# Patient Record
Sex: Male | Born: 1975 | Race: Black or African American | Hispanic: No | Marital: Single | Smoking: Current every day smoker
Health system: Southern US, Community
[De-identification: ages and names within clinical notes are randomized; demographics above are authoritative.]

## PROBLEM LIST (undated history)

## (undated) DIAGNOSIS — K219 Gastro-esophageal reflux disease without esophagitis: Secondary | ICD-10-CM

## (undated) DIAGNOSIS — G459 Transient cerebral ischemic attack, unspecified: Secondary | ICD-10-CM

## (undated) DIAGNOSIS — I10 Essential (primary) hypertension: Secondary | ICD-10-CM

---

## 2016-08-07 ENCOUNTER — Ambulatory Visit: Admit: 2016-08-07 | Discharge: 2016-08-07 | Payer: PRIVATE HEALTH INSURANCE | Attending: Family Medicine

## 2016-08-07 DIAGNOSIS — I10 Essential (primary) hypertension: Secondary | ICD-10-CM

## 2016-08-07 MED ORDER — LISINOPRIL-HYDROCHLOROTHIAZIDE 20 MG-25 MG TAB
20-25 mg | ORAL_TABLET | Freq: Every day | ORAL | 2 refills | Status: DC
Start: 2016-08-07 — End: 2017-04-15

## 2016-08-07 MED ORDER — LOVASTATIN 20 MG TAB
20 mg | ORAL_TABLET | Freq: Every evening | ORAL | 2 refills | Status: DC
Start: 2016-08-07 — End: 2017-04-15

## 2016-08-07 MED ORDER — ASPIRIN 81 MG TAB, DELAYED RELEASE
81 mg | ORAL_TABLET | Freq: Every day | ORAL | 2 refills | Status: DC
Start: 2016-08-07 — End: 2017-04-15

## 2016-08-07 MED ORDER — AMLODIPINE 10 MG TAB
10 mg | ORAL_TABLET | Freq: Every day | ORAL | 2 refills | Status: DC
Start: 2016-08-07 — End: 2017-04-15

## 2016-08-07 NOTE — Progress Notes (Signed)
HPI  Arthur IvanoffClarence Thompson is a 41 y.o. male being seen today for   Chief Complaint   Patient presents with   ??? New Patient     Establsih healthcare services   ??? Hypertension     Monitor and medicate HTN   ??? Hospital Follow Up     HTN   .iov for this pt to care a Arthur Thompson  he states that he has high blood presure.  Previously on amlodipine, lisinopril and hctz.   Recently restarted lisin/hctz from ED but he does not have his amlodipine and bp still high.     Past Medical History:   Diagnosis Date   ??? GERD (gastroesophageal reflux disease) 2010   ??? Hypertension 2000   ??? Stroke (HCC) 2014         ROS  Patient states that he is feeling well. Denies complaints of chest pain, shortness of breath, swelling of legs, dizziness or weakness. he denies nausea, vomiting or diarrhea.        Current Outpatient Prescriptions   Medication Sig   ??? amLODIPine (NORVASC) 10 mg tablet Take 1 Tab by mouth daily.   ??? lovastatin (MEVACOR) 20 mg tablet Take 1 Tab by mouth nightly.   ??? aspirin delayed-release 81 mg tablet Take 1 Tab by mouth daily.   ??? lisinopril-hydroCHLOROthiazide (PRINZIDE, ZESTORETIC) 20-25 mg per tablet Take 1 Tab by mouth daily.     No current facility-administered medications for this visit.        PE  Visit Vitals   ??? BP (!) 190/120 (BP 1 Location: Right arm, BP Patient Position: Sitting)   ??? Pulse 80   ??? Temp 97 ??F (36.1 ??C) (Oral)   ??? Resp 18   ??? Ht 5\' 10"  (1.778 m)   ??? Wt 200 lb (90.7 kg)   ??? SpO2 99%   ??? BMI 28.7 kg/m2        Alert and oriented with normal mood and affect. he is well developed and well nourished . Lungs are clear without wheezing. Heart rate is regular without murmurs or gallops. There is no lower extremity edema.     No results found for this or any previous visit.      Assessment and Plan:        ICD-10-CM ICD-9-CM    1. Essential hypertension I10 401.9    2. Smoker F17.200 305.1    3. CKD (chronic kidney disease) stage 2, GFR 60-89 ml/min N18.2 585.2      Review outside records   Follow up one month check bp and lab  Add amlodipine, statin today      Donnie MesaEmily W Doron Shake, MD

## 2016-08-07 NOTE — Progress Notes (Signed)
1. When and where did you last receive medical care?  Yes [facility] ED HTN-     2.When and where did you last have preventive care such as mammogram, pap smears or colon screening? N/A    3. What is your current living situation (for example, live alone, live in home with immediate family members)? [place] [facility]    4. Do you have any problems with communication such trouble seeing, hearing, or understanding instructions?No    5. Do you have an advance directive?  This is a document that you can give to family members with instructions for how you would want them to make health care decisions for you if you were unable to speak for yourself.  (For example, unconscious, delerious)No    PMH/FH/Social Hx reviewed and updated as needed     Applicable screenings reviewed and updated as needed  Medication reconciliation performed. Patient does need medication refills.  Health Maintenance reviewed.

## 2016-08-07 NOTE — Progress Notes (Signed)
Discharge instructions reviewed with patient    Medication list and understanding of medications reviewed with patient.   OTC and herbal medications reviewed and added to med list if applicable  Barriers to adherence assessed.    Guidance given regarding new medications this visit, including reason for taking this medicine, and common side effects.    [facility]

## 2016-09-11 ENCOUNTER — Encounter: Attending: Family Medicine

## 2017-02-19 ENCOUNTER — Encounter (HOSPITAL_COMMUNITY): Payer: Self-pay

## 2017-02-19 ENCOUNTER — Emergency Department (HOSPITAL_COMMUNITY): Payer: Self-pay

## 2017-02-19 DIAGNOSIS — R079 Chest pain, unspecified: Secondary | ICD-10-CM | POA: Insufficient documentation

## 2017-02-19 DIAGNOSIS — Z5321 Procedure and treatment not carried out due to patient leaving prior to being seen by health care provider: Secondary | ICD-10-CM | POA: Insufficient documentation

## 2017-02-19 LAB — BASIC METABOLIC PANEL
ANION GAP: 10 (ref 5–15)
BUN: 19 mg/dL (ref 6–20)
CO2: 28 mmol/L (ref 22–32)
Calcium: 9.6 mg/dL (ref 8.9–10.3)
Chloride: 99 mmol/L — ABNORMAL LOW (ref 101–111)
Creatinine, Ser: 1.46 mg/dL — ABNORMAL HIGH (ref 0.61–1.24)
GFR, EST NON AFRICAN AMERICAN: 58 mL/min — AB (ref >60)
GLUCOSE: 97 mg/dL (ref 65–99)
POTASSIUM: 3.4 mmol/L — AB (ref 3.5–5.1)
Sodium: 137 mmol/L (ref 135–145)

## 2017-02-19 LAB — CBC
HEMATOCRIT: 43.1 % (ref 39.0–52.0)
HEMOGLOBIN: 14.5 g/dL (ref 13.0–17.0)
MCH: 29.1 pg (ref 26.0–34.0)
MCHC: 33.6 g/dL (ref 30.0–36.0)
MCV: 86.5 fL (ref 78.0–100.0)
Platelets: 270 10*3/uL (ref 150–400)
RBC: 4.98 MIL/uL (ref 4.22–5.81)
RDW: 13.3 % (ref 11.5–15.5)
WBC: 8.8 10*3/uL (ref 4.0–10.5)

## 2017-02-19 LAB — I-STAT TROPONIN, ED: Troponin i, poc: 0 ng/mL (ref 0.00–0.08)

## 2017-02-19 NOTE — ED Triage Notes (Signed)
Pt states that he started having CP two days ago, central, non radiating, along with nausea/vomiing. Pt states he took tums and zantac without relief.

## 2017-02-20 ENCOUNTER — Emergency Department (HOSPITAL_COMMUNITY)
Admission: EM | Admit: 2017-02-20 | Discharge: 2017-02-20 | Disposition: A | Discharge status: Home / Self Care | Payer: Self-pay | Attending: Emergency Medicine | Admitting: Emergency Medicine

## 2017-02-20 HISTORY — DX: Transient cerebral ischemic attack, unspecified: G45.9

## 2017-02-20 HISTORY — DX: Gastro-esophageal reflux disease without esophagitis: K21.9

## 2017-02-20 HISTORY — DX: Essential (primary) hypertension: I10

## 2017-02-20 NOTE — ED Notes (Signed)
Did not respond to name 

## 2017-04-16 MED ORDER — LISINOPRIL-HYDROCHLOROTHIAZIDE 20 MG-25 MG TAB
20-25 mg | ORAL_TABLET | ORAL | 0 refills | Status: DC
Start: 2017-04-16 — End: 2017-04-16

## 2017-04-16 MED ORDER — ASPIRIN 81 MG TAB, DELAYED RELEASE
81 mg | ORAL_TABLET | ORAL | 0 refills | Status: DC
Start: 2017-04-16 — End: 2017-05-05

## 2017-04-16 MED ORDER — LOVASTATIN 20 MG TAB
20 mg | ORAL_TABLET | ORAL | 0 refills | Status: DC
Start: 2017-04-16 — End: 2017-04-16

## 2017-04-16 MED ORDER — AMLODIPINE 10 MG TAB
10 mg | ORAL_TABLET | ORAL | 0 refills | Status: DC
Start: 2017-04-16 — End: 2017-04-16

## 2017-04-20 MED ORDER — LOVASTATIN 20 MG TAB
20 mg | ORAL_TABLET | ORAL | 0 refills | Status: DC
Start: 2017-04-20 — End: 2017-09-04

## 2017-04-20 MED ORDER — LISINOPRIL-HYDROCHLOROTHIAZIDE 20 MG-25 MG TAB
20-25 mg | ORAL_TABLET | ORAL | 0 refills | Status: DC
Start: 2017-04-20 — End: 2017-09-04

## 2017-04-20 MED ORDER — AMLODIPINE 10 MG TAB
10 mg | ORAL_TABLET | ORAL | 0 refills | Status: DC
Start: 2017-04-20 — End: 2017-09-04

## 2017-05-05 MED ORDER — ASPIRIN 81 MG TAB, DELAYED RELEASE
81 mg | ORAL_TABLET | ORAL | 0 refills | Status: DC
Start: 2017-05-05 — End: 2017-09-04

## 2017-09-05 MED ORDER — ASPIRIN 81 MG TAB, DELAYED RELEASE
81 mg | ORAL_TABLET | ORAL | 0 refills | Status: DC
Start: 2017-09-05 — End: 2019-01-05

## 2017-09-05 MED ORDER — AMLODIPINE 10 MG TAB
10 mg | ORAL_TABLET | ORAL | 1 refills | Status: DC
Start: 2017-09-05 — End: 2018-01-29

## 2017-09-05 MED ORDER — LOVASTATIN 20 MG TAB
20 mg | ORAL_TABLET | ORAL | 1 refills | Status: DC
Start: 2017-09-05 — End: 2018-07-01

## 2017-09-05 MED ORDER — LISINOPRIL-HYDROCHLOROTHIAZIDE 20 MG-25 MG TAB
20-25 mg | ORAL_TABLET | ORAL | 1 refills | Status: DC
Start: 2017-09-05 — End: 2018-07-01

## 2017-11-29 ENCOUNTER — Other Ambulatory Visit: Payer: Self-pay

## 2017-11-29 ENCOUNTER — Emergency Department (HOSPITAL_COMMUNITY): Payer: Self-pay

## 2017-11-29 ENCOUNTER — Encounter (HOSPITAL_COMMUNITY): Payer: Self-pay

## 2017-11-29 ENCOUNTER — Emergency Department (HOSPITAL_COMMUNITY)
Admission: EM | Admit: 2017-11-29 | Discharge: 2017-11-29 | Disposition: A | Discharge status: Home / Self Care | Payer: Self-pay | Attending: Emergency Medicine | Admitting: Emergency Medicine

## 2017-11-29 DIAGNOSIS — R51 Headache: Secondary | ICD-10-CM | POA: Insufficient documentation

## 2017-11-29 DIAGNOSIS — R519 Headache, unspecified: Secondary | ICD-10-CM

## 2017-11-29 DIAGNOSIS — Z7982 Long term (current) use of aspirin: Secondary | ICD-10-CM | POA: Insufficient documentation

## 2017-11-29 DIAGNOSIS — I10 Essential (primary) hypertension: Secondary | ICD-10-CM | POA: Insufficient documentation

## 2017-11-29 DIAGNOSIS — F17210 Nicotine dependence, cigarettes, uncomplicated: Secondary | ICD-10-CM | POA: Insufficient documentation

## 2017-11-29 DIAGNOSIS — Z79899 Other long term (current) drug therapy: Secondary | ICD-10-CM | POA: Insufficient documentation

## 2017-11-29 MED ORDER — PROCHLORPERAZINE EDISYLATE 10 MG/2ML IJ SOLN
10.0000 mg | Freq: Once | INTRAMUSCULAR | Status: AC
  Administered 2017-11-29: 10 mg via INTRAMUSCULAR
  Filled 2017-11-29: qty 2

## 2017-11-29 MED ORDER — KETOROLAC TROMETHAMINE 60 MG/2ML IM SOLN
60.0000 mg | Freq: Once | INTRAMUSCULAR | Status: AC
  Administered 2017-11-29: 60 mg via INTRAMUSCULAR
  Filled 2017-11-29: qty 2

## 2017-11-29 NOTE — ED Triage Notes (Signed)
Pt states "real bad headaches" x 1 week. Pt states headache in the back of his head. Pt states that it will ease up at times, but is sometimes worse. Pt states that in the past, his headaches have been related to high BP

## 2017-11-29 NOTE — Discharge Instructions (Addendum)
Please follow up with a neurologist for further evaluation  Please follow up with your medical provider in 3-5 days if symptoms persist

## 2017-11-29 NOTE — ED Notes (Signed)
Patient agrees with discharge instructions, no further questions. Unable to sign due to computer issues.
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
## 2018-01-29 MED ORDER — AMLODIPINE 10 MG TAB
10 mg | ORAL_TABLET | ORAL | 1 refills | Status: DC
Start: 2018-01-29 — End: 2018-07-01

## 2018-01-29 NOTE — Telephone Encounter (Signed)
Can you call this pt and make him a follow up bp visit with [place]? thx

## 2018-02-04 NOTE — Telephone Encounter (Signed)
Attempted to call patient to schedule follow up appointment. Mobile number is busy. Will try patient at a later date.

## 2018-02-11 NOTE — Telephone Encounter (Signed)
Attempted to call patient mobile number to schedule follow up appointment. Mobile number is busy. Letter mailed out to patient.

## 2018-07-01 ENCOUNTER — Ambulatory Visit: Attending: Family Medicine

## 2018-07-01 ENCOUNTER — Ambulatory Visit: Admit: 2018-07-01 | Discharge: 2018-07-01 | Attending: Family Medicine

## 2018-07-01 DIAGNOSIS — I10 Essential (primary) hypertension: Secondary | ICD-10-CM

## 2018-07-01 LAB — AMB POC URINALYSIS DIP STICK AUTO W/O MICRO
Bilirubin (UA POC): NEGATIVE
Bilirubin, Urine, POC: NEGATIVE
Blood (UA POC): NEGATIVE
Blood (UA POC): NEGATIVE
Glucose (UA POC): NEGATIVE
Glucose, Urine, POC: NEGATIVE
Ketones (UA POC): NEGATIVE
Ketones, Urine, POC: NEGATIVE
Leukocyte Esterase, Urine, POC: NEGATIVE
Leukocyte esterase (UA POC): NEGATIVE
Nitrite, Urine, POC: NEGATIVE
Nitrites (UA POC): NEGATIVE
Protein (UA POC): NEGATIVE
Protein, Urine, POC: NEGATIVE
Specific Gravity, Urine, POC: 1.02 NA (ref 1.001–1.035)
Specific gravity (UA POC): 1.02 (ref 1.001–1.035)
Urobilinogen (UA POC): 0.2 (ref 0.2–1)
Urobilinogen, POC: 0.2 (ref 0.2–1)
pH (UA POC): 7 (ref 4.6–8.0)
pH, Urine, POC: 7 NA (ref 4.6–8.0)

## 2018-07-01 MED ORDER — HYDRALAZINE 25 MG TAB
25 mg | ORAL_TABLET | Freq: Three times a day (TID) | ORAL | 2 refills | Status: DC
Start: 2018-07-01 — End: 2018-09-17

## 2018-07-01 MED ORDER — LISINOPRIL-HYDROCHLOROTHIAZIDE 20 MG-12.5 MG TAB
ORAL_TABLET | Freq: Every day | ORAL | 2 refills | Status: DC
Start: 2018-07-01 — End: 2019-01-01

## 2018-07-01 MED ORDER — PANTOPRAZOLE 40 MG TAB, DELAYED RELEASE
40 mg | ORAL_TABLET | Freq: Every day | ORAL | 2 refills | Status: DC
Start: 2018-07-01 — End: 2019-01-01

## 2018-07-01 MED ORDER — AMLODIPINE 10 MG TAB
10 mg | ORAL_TABLET | ORAL | 2 refills | Status: DC
Start: 2018-07-01 — End: 2019-03-05

## 2018-07-01 MED ORDER — BUPROPION SR 150 MG TAB
150 mg | ORAL_TABLET | Freq: Two times a day (BID) | ORAL | 2 refills | Status: DC
Start: 2018-07-01 — End: 2018-12-29

## 2018-07-01 MED ORDER — ATORVASTATIN 80 MG TAB
80 mg | ORAL_TABLET | Freq: Every day | ORAL | 2 refills | Status: DC
Start: 2018-07-01 — End: 2019-01-01

## 2018-07-01 NOTE — Progress Notes (Signed)
 Chief Complaint   Patient presents with   . Hypertension     medication refill -dizziness ,headaches x 2 weeks     1. Have you been to the ER, urgent care clinic since your last visit?  Hospitalized since your last visit?Yes When: 06/13/2018-ED [facility].    2. Have you seen or consulted any other health care providers outside of the [facility] since your last visit?  Include any pap smears or colon screening.  Yes -see above

## 2018-07-01 NOTE — Progress Notes (Signed)
HPI  Arthur Thompson is a 43 y.o. male being seen today for   Chief Complaint   Patient presents with   ??? Hypertension     medication refill -dizziness ,headaches x 2 weeks   ??? LOW BACK PAIN     x 2 weeks   follow up for this pt to care a van. Not seen since 2018.  He has high blood pressure and possible history of stroke. He has some notes stating history of TIA, not stroke.  His mri brain was normal in 2001 but none available since then.     He was living in [facility] for awhile, last seen by PCP in [place] in may 2019.  At that time he had his lisinopril hctz increased from 20/12.5  to 40/25.    he states that for the last month he has urinary frequecny and thinks it may be from the meds.  No discharge or burning.  He is taking his meds as prescribed.     Has a headache, mild.  He states he gets HA like this when bp is up.  He smokes about 1/2 ppd most days but only smoking 1-2 cig for last few days and would like to quit.  He would like bedrest note for [facility] until his HA goes away.  On chart review he does have a neuro referral in [place] for headaches but does not look like he was ever seen.     Past Medical History:   Diagnosis Date   ??? GERD (gastroesophageal reflux disease) 2010   ??? Hypertension 2000   ??? Stroke (HCC) 2014         ROS  Patient states that he is feeling well. Denies complaints of chest pain, shortness of breath, swelling of legs, dizziness or weakness. he denies nausea, vomiting or diarrhea.        Current Outpatient Medications   Medication Sig   ??? lisinopril-hydroCHLOROthiazide (PRINZIDE, ZESTORETIC) 20-12.5 mg per tablet Take 2 Tabs by mouth daily.   ??? hydrALAZINE (APRESOLINE) 25 mg tablet Take 1 Tab by mouth three (3) times daily.   ??? amLODIPine (NORVASC) 10 mg tablet TAKE 1 TABLET BY MOUTH ONCE DAILY   ??? atorvastatin (LIPITOR) 80 mg tablet Take 1 Tab by mouth daily.   ??? buPROPion SR (WELLBUTRIN SR) 150 mg SR tablet Take 1 Tab by mouth two (2) times a day.   ??? pantoprazole (PROTONIX) 40 mg tablet Take 1  Tab by mouth daily.   ??? aspirin delayed-release 81 mg tablet TAKE 1 TABLET BY MOUTH EVERY DAY     No current facility-administered medications for this visit.        PE  Visit Vitals  BP (!) 158/110 (BP 1 Location: Left arm, BP Patient Position: Sitting) Comment (BP Patient Position): manual   Pulse 76   Temp 97.1 ??F (36.2 ??C) (Temporal)   Resp 16   Ht 5\' 10"  (1.778 m)   Wt 218 lb (98.9 kg)   SpO2 98%   BMI 31.28 kg/m??        Alert and oriented with normal mood and affect. he is well developed and well nourished . Lungs are clear without wheezing. Heart rate is regular without murmurs or gallops. There is no lower extremity edema.  PERRL, EOMI.  Neuro grossly intact.     Results for orders placed or performed in visit on 07/01/18   AMB POC URINALYSIS DIP STICK AUTO W/O MICRO   Result Value Ref Range  Color (UA POC) Colorless     Clarity (UA POC) Clear     Glucose (UA POC) Negative Negative    Bilirubin (UA POC) Negative Negative    Ketones (UA POC) Negative Negative    Specific gravity (UA POC) 1.020 1.001 - 1.035    Blood (UA POC) Negative Negative    pH (UA POC) 7.0 4.6 - 8.0    Protein (UA POC) Negative Negative    Urobilinogen (UA POC) 0.2 mg/dL 0.2 - 1    Nitrites (UA POC) Negative Negative    Leukocyte esterase (UA POC) Negative Negative         Assessment and Plan:        ICD-10-CM ICD-9-CM    1. Essential hypertension  Continue amlodipine and lisin/hctz.  Refills given.  Quit smoking, add hydralazine for additional bp control     Recheck 2 weeks I10 401.9    2. History of stroke  Work on bp control, continue statin, aspirin Z86.73 V12.54    3. Urinary frequency  ua reassuring.  Unclear if due to hctz but will continue for now.  Consider decrease back to 12.5 if bp controled R35.0 788.41    4. Regular check-up Z00.00 V70.0 AMB POC URINALYSIS DIP STICK AUTO W/O MICRO   5. Smoker  rx wellbutrin.  Reviewed quit plan.  Pt is motivated.  Follow up 2 weeks F17.200 305.1    6. Headache disorder  Unclear if this is  related to BP. reeval after bp under better control R51 784.0            Donnie Mesa, MD

## 2018-07-01 NOTE — Progress Notes (Signed)
Discharge instructions reviewed with patient    Medication list and understanding of medications reviewed with patient.   OTC and herbal medications reviewed and added to med list if applicable  Barriers to adherence assessed.    Guidance given regarding new medications this visit, including reason for taking this medicine, and common side effects.

## 2018-07-01 NOTE — Progress Notes (Signed)
HPI  Arthur Thompson is a 43 y.o. male being seen today for   Chief Complaint   Patient presents with   ??? Hypertension     medication refill -dizziness ,headaches x 2 weeks   ??? LOW BACK PAIN     x 2 weeks   follow up for this pt to [facility]. Not seen since 2018.  He has high blood pressure and possible history of stroke. He has some notes stating history of TIA, not stroke.  His mri brain was normal in 2001 but none available since then.     He was living in [facility] for awhile, last seen by PCP in [place] in may 2019.  At that time he had his lisinopril hctz increased from 20/12.5  to 40/25.    he states that for the last month he has urinary frequecny and thinks it may be from the meds.  No discharge or burning.  He is taking his meds as prescribed.     Has a headache, mild.  He states he gets HA like this when bp is up.  He smokes about 1/2 ppd most days but only smoking 1-2 cig for last few days and would like to quit.  He would like bedrest note for [facility] until his HA goes away.  On chart review he does have a neuro referral in [place] for headaches but does not look like he was ever seen.     Past Medical History:   Diagnosis Date   ??? GERD (gastroesophageal reflux disease) 2010   ??? Hypertension 2000   ??? Stroke (HCC) 2014         ROS  Patient states that he is feeling well. Denies complaints of chest pain, shortness of breath, swelling of legs, dizziness or weakness. he denies nausea, vomiting or diarrhea.        Current Outpatient Medications   Medication Sig   ??? lisinopril-hydroCHLOROthiazide (PRINZIDE, ZESTORETIC) 20-12.5 mg per tablet Take 2 Tabs by mouth daily.   ??? hydrALAZINE (APRESOLINE) 25 mg tablet Take 1 Tab by mouth three (3) times daily.   ??? amLODIPine (NORVASC) 10 mg tablet TAKE 1 TABLET BY MOUTH ONCE DAILY   ??? atorvastatin (LIPITOR) 80 mg tablet Take 1 Tab by mouth daily.   ??? buPROPion SR (WELLBUTRIN SR) 150 mg SR tablet Take 1 Tab by mouth two (2) times a day.    ??? pantoprazole (PROTONIX) 40 mg tablet Take 1 Tab by mouth daily.   ??? aspirin delayed-release 81 mg tablet TAKE 1 TABLET BY MOUTH EVERY DAY     No current facility-administered medications for this visit.        PE  Visit Vitals  BP (!) 158/110 (BP 1 Location: Left arm, BP Patient Position: Sitting) Comment (BP Patient Position): manual   Pulse 76   Temp 97.1 ??F (36.2 ??C) (Temporal)   Resp 16   Ht 5\' 10"  (1.778 m)   Wt 218 lb (98.9 kg)   SpO2 98%   BMI 31.28 kg/m??        Alert and oriented with normal mood and affect. he is well developed and well nourished . Lungs are clear without wheezing. Heart rate is regular without murmurs or gallops. There is no lower extremity edema.  PERRL, EOMI.  Neuro grossly intact.     Results for orders placed or performed in visit on 07/01/18   AMB POC URINALYSIS DIP STICK AUTO W/O MICRO   Result Value Ref Range  Color (UA POC) Colorless     Clarity (UA POC) Clear     Glucose (UA POC) Negative Negative    Bilirubin (UA POC) Negative Negative    Ketones (UA POC) Negative Negative    Specific gravity (UA POC) 1.020 1.001 - 1.035    Blood (UA POC) Negative Negative    pH (UA POC) 7.0 4.6 - 8.0    Protein (UA POC) Negative Negative    Urobilinogen (UA POC) 0.2 mg/dL 0.2 - 1    Nitrites (UA POC) Negative Negative    Leukocyte esterase (UA POC) Negative Negative         Assessment and Plan:        ICD-10-CM ICD-9-CM    1. Essential hypertension  Continue amlodipine and lisin/hctz.  Refills given.  Quit smoking, add hydralazine for additional bp control     Recheck 2 weeks I10 401.9    2. History of stroke  Work on bp control, continue statin, aspirin Z86.73 V12.54    3. Urinary frequency  ua reassuring.  Unclear if due to hctz but will continue for now.  Consider decrease back to 12.5 if bp controled R35.0 788.41    4. Regular check-up Z00.00 V70.0 AMB POC URINALYSIS DIP STICK AUTO W/O MICRO   5. Smoker  rx wellbutrin.  Reviewed quit plan.  Pt is motivated.  Follow up 2 weeks  F17.200 305.1    6. Headache disorder  Unclear if this is related to BP. reeval after bp under better control R51 784.0            Donnie Mesa, MD

## 2018-07-01 NOTE — Progress Notes (Signed)
Chief Complaint   Patient presents with   ??? Hypertension     medication refill -dizziness ,headaches x 2 weeks     1. Have you been to the ER, urgent care clinic since your last visit?  Hospitalized since your last visit?Yes When: 06/13/2018-ED [place]    2. Have you seen or consulted any other health care providers outside of the [facility] since your last visit?  Include any pap smears or colon screening.  Yes -see above

## 2018-07-15 ENCOUNTER — Encounter: Attending: Family Medicine

## 2018-09-17 MED ORDER — HYDRALAZINE 25 MG TAB
25 mg | ORAL_TABLET | ORAL | 0 refills | Status: DC
Start: 2018-09-17 — End: 2018-12-30

## 2018-11-17 IMAGING — CT CT HEAD W/O CM
3 series · 14 of 47 positions shown, 16 images · non-contrast
Comparison: None.

CLINICAL DATA: Headache for the past week. History of hypertension.

EXAM:
CT HEAD WITHOUT CONTRAST
TECHNIQUE: Contiguous axial images were obtained from the base of the skull
through the vertex without intravenous contrast.

[Series 2: head wo · axial · 0.47mm/px · z∈[-86,+54]mm · 8 of 34 slices shown, 10 images]
[im 3/34  brain]
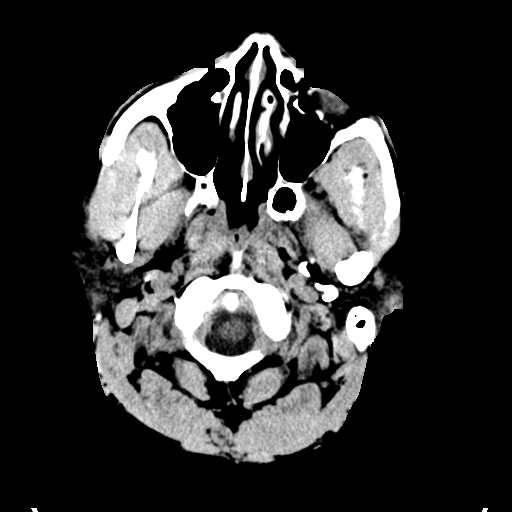
[im 3/34  bone]
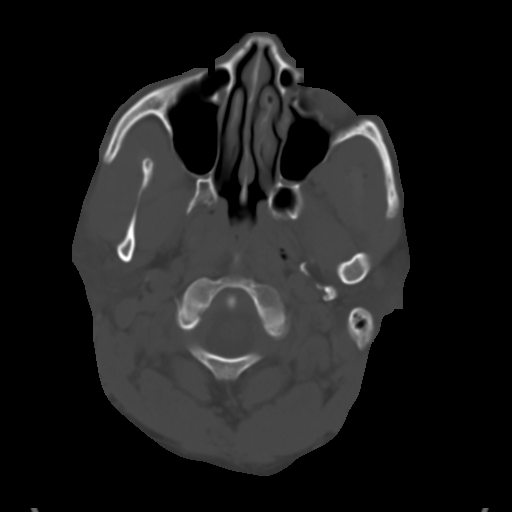
[im 7/34  brain]
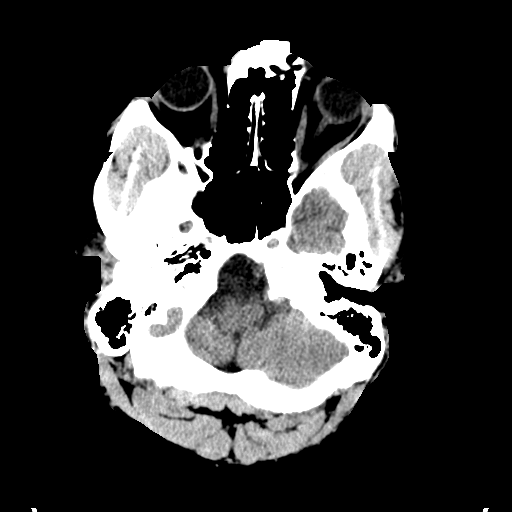
[im 11/34  brain]
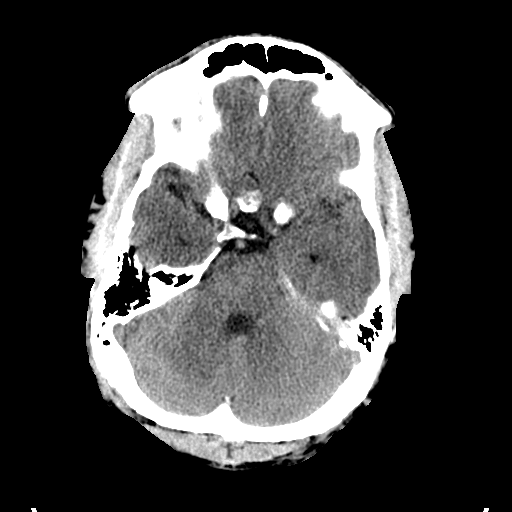
[im 15/34  brain]
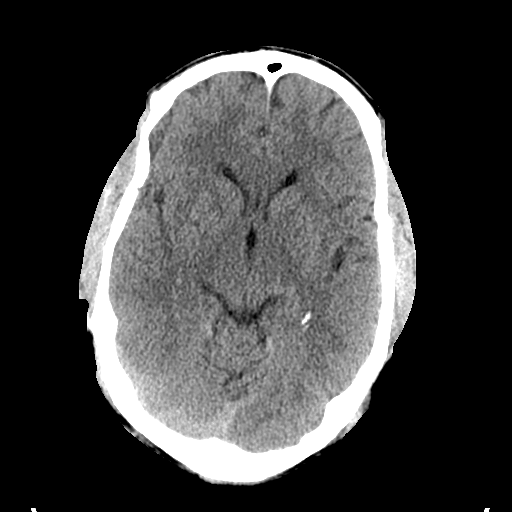
[im 19/34  brain]
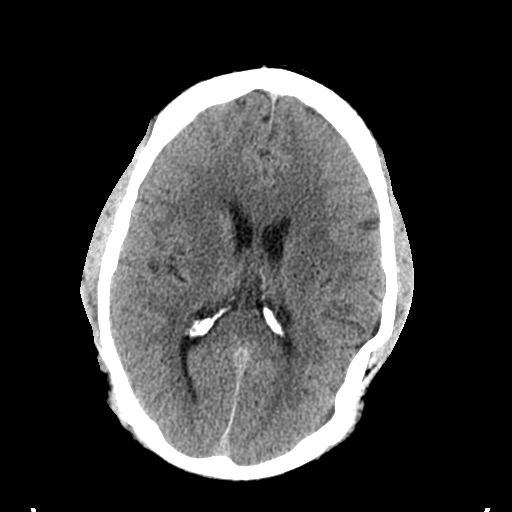
[im 19/34  bone]
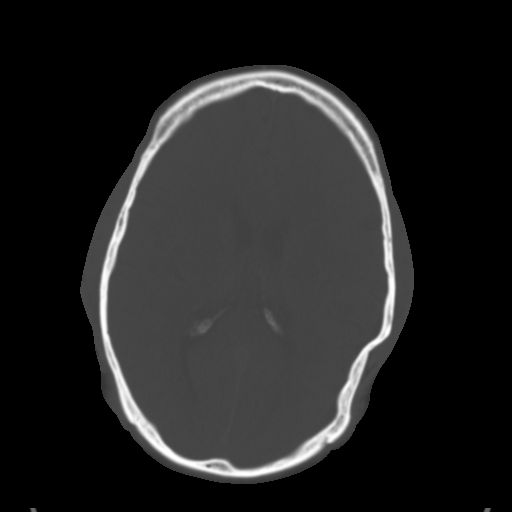
[im 23/34  brain]
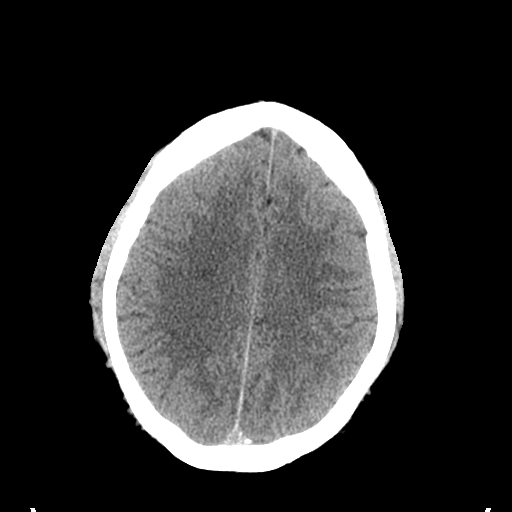
[im 27/34  brain]
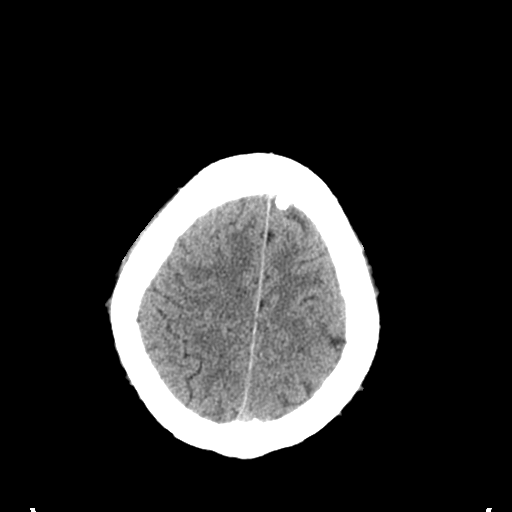
[im 31/34  brain]
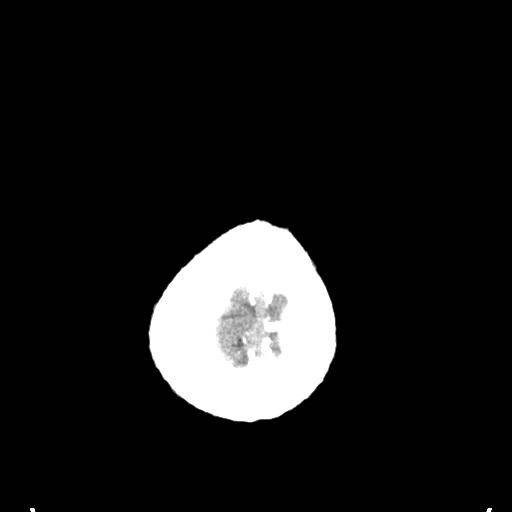

[Series 5: coronal soft tissue · coronal · 0.32mm/px · 3 of 84 slices shown]
[im 28/84  brain]
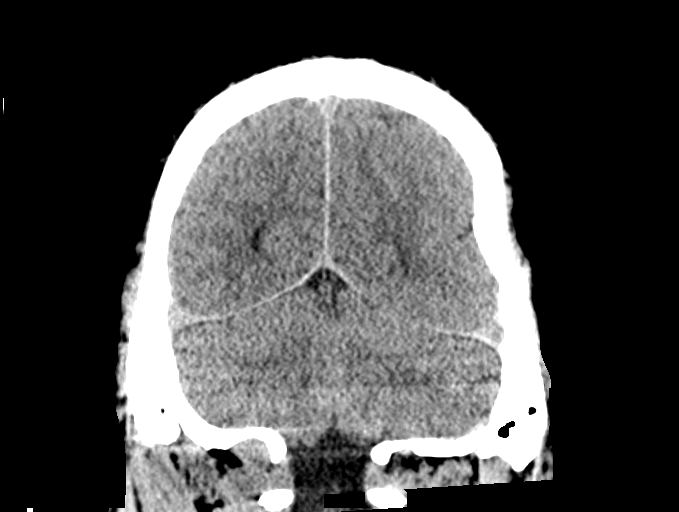
[im 37/84  brain]
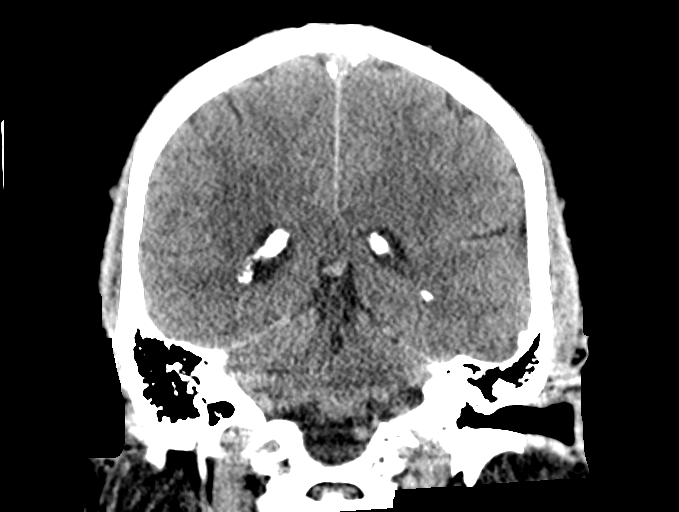
[im 47/84  brain]
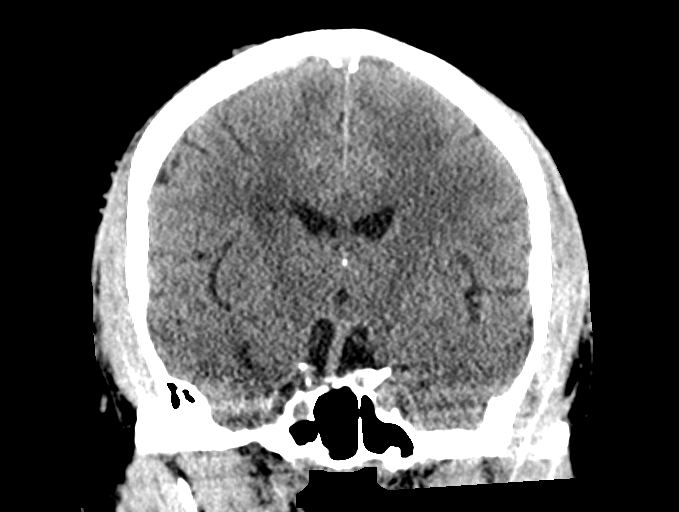

[Series 6: sagittal soft tissue · sagittal · 0.32mm/px · 3 of 66 slices shown]
[im 22/66  brain]
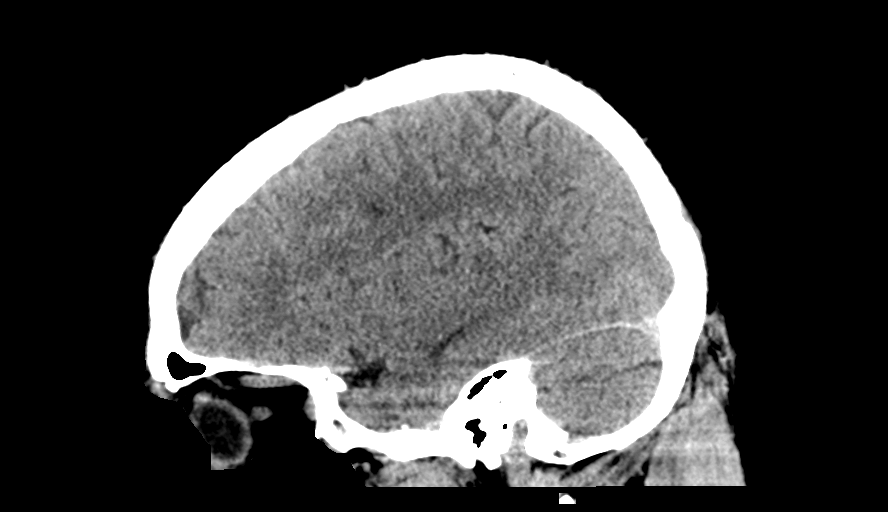
[im 33/66  brain]
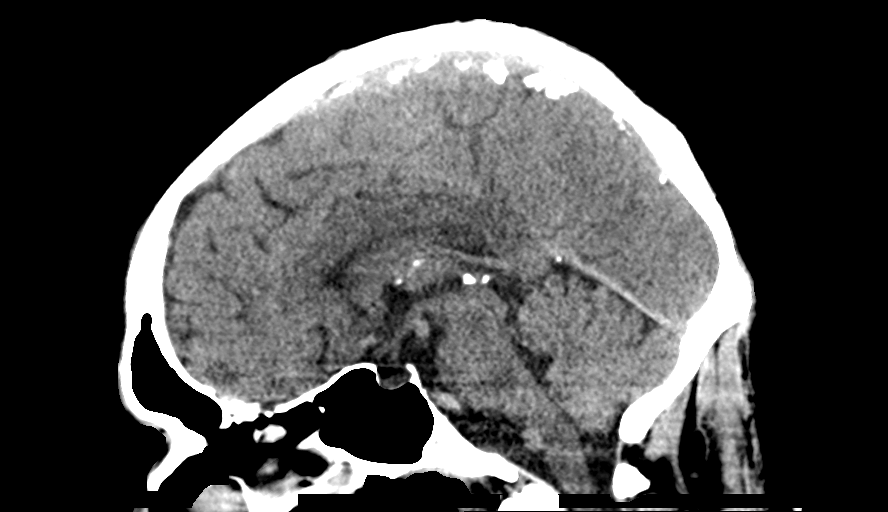
[im 44/66  brain]
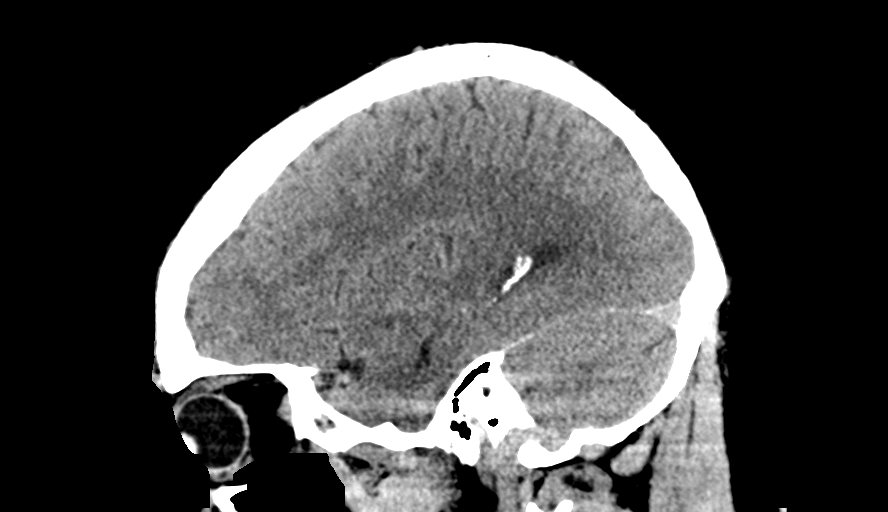

[14 of 47 positions shown; findings below may reference images not displayed]

FINDINGS: Brain: Scattered periventricular hypodensities (images 20 and 23)
likely represent the sequela of microvascular ischemic disease. The
gray-white differentiation is otherwise well maintained without CT
evidence of acute large territory infarct. No intraparenchymal or
extra-axial mass or hemorrhage. Normal size and configuration of the
ventricles and basilar cisterns. No midline shift

Vascular: No hyperdense vessel or unexpected calcification.

Skull: No displaced calvarial fracture though note is made of focal
concavity involving the posterior aspect of the left parietal
calvarium (image 18, series 3).

Sinuses/Orbits: Limited visualization of the paranasal sinuses and
mastoid air cells is normal. No air-fluid levels. A minimal amount
of debris is seen within the right external auditory canal.

Other: Regional soft tissues appear normal.
IMPRESSION: Microvascular ischemic disease without superimposed acute
intracranial process.

## 2018-12-29 MED ORDER — BUPROPION SR 150 MG TAB
150 mg | ORAL_TABLET | ORAL | 2 refills | Status: AC
Start: 2018-12-29 — End: ?

## 2018-12-30 MED ORDER — HYDRALAZINE 25 MG TAB
25 mg | ORAL_TABLET | ORAL | 0 refills | Status: DC
Start: 2018-12-30 — End: 2019-03-05

## 2019-01-01 MED ORDER — PANTOPRAZOLE 40 MG TAB, DELAYED RELEASE
40 mg | ORAL_TABLET | ORAL | 1 refills | Status: DC
Start: 2019-01-01 — End: 2019-03-05

## 2019-01-01 MED ORDER — ATORVASTATIN 80 MG TAB
80 mg | ORAL_TABLET | ORAL | 1 refills | Status: DC
Start: 2019-01-01 — End: 2019-03-05

## 2019-01-01 MED ORDER — LISINOPRIL-HYDROCHLOROTHIAZIDE 20 MG-12.5 MG TAB
ORAL_TABLET | ORAL | 1 refills | Status: AC
Start: 2019-01-01 — End: ?

## 2019-01-05 MED ORDER — ASPIRIN 81 MG TAB, DELAYED RELEASE
81 mg | ORAL_TABLET | ORAL | 0 refills | Status: DC
Start: 2019-01-05 — End: 2019-05-21

## 2019-03-05 MED ORDER — HYDRALAZINE 25 MG TAB
25 mg | ORAL_TABLET | ORAL | 0 refills | Status: DC
Start: 2019-03-05 — End: 2019-04-06

## 2019-03-05 MED ORDER — ATORVASTATIN 80 MG TAB
80 mg | ORAL_TABLET | ORAL | 1 refills | Status: AC
Start: 2019-03-05 — End: ?

## 2019-03-05 MED ORDER — AMLODIPINE 10 MG TAB
10 mg | ORAL_TABLET | ORAL | 0 refills | Status: AC
Start: 2019-03-05 — End: ?

## 2019-03-05 MED ORDER — PANTOPRAZOLE 40 MG TAB, DELAYED RELEASE
40 mg | ORAL_TABLET | ORAL | 1 refills | Status: DC
Start: 2019-03-05 — End: 2019-05-30

## 2019-03-09 NOTE — Telephone Encounter (Signed)
Spoke with pharmacist states patient now lives in [place] and patient is seeing new PCP Loleta Dicker MD  [phone]

## 2019-04-06 MED ORDER — HYDRALAZINE 25 MG TAB
25 mg | ORAL_TABLET | ORAL | 0 refills | Status: DC
Start: 2019-04-06 — End: 2019-05-23

## 2019-04-06 NOTE — Telephone Encounter (Signed)
This pt needs follow up.  His pharmacy is now in [place].  Can you contact hm to see if he has insurance?  If not he should come see [place] or [place] [facility] if he has permanently relocated.

## 2019-04-07 NOTE — Telephone Encounter (Signed)
Attempted to reach out to patient to see if he has insurance and a change of address. Left patient a voicemail to give the office a call back.

## 2019-05-21 MED ORDER — ASPIRIN 81 MG TAB, DELAYED RELEASE
81 mg | ORAL_TABLET | ORAL | 0 refills | Status: AC
Start: 2019-05-21 — End: ?

## 2019-05-24 MED ORDER — HYDRALAZINE 25 MG TAB
25 mg | ORAL_TABLET | ORAL | 0 refills | Status: AC
Start: 2019-05-24 — End: ?

## 2019-05-30 MED ORDER — PANTOPRAZOLE 40 MG TAB, DELAYED RELEASE
40 mg | ORAL_TABLET | ORAL | 1 refills | Status: AC
Start: 2019-05-30 — End: ?

## 2019-06-29 NOTE — Telephone Encounter (Signed)
2nd attempt to reach out to patient. Mobile number is no longer in service. Follow up letter mailed out to patient.
